# Patient Record
Sex: Female | Born: 1955 | Race: Black or African American | Hispanic: No | Marital: Married | Smoking: Never smoker
Health system: Southern US, Community
[De-identification: ages and names within clinical notes are randomized; demographics above are authoritative.]

## PROBLEM LIST (undated history)

## (undated) DIAGNOSIS — E785 Hyperlipidemia, unspecified: Secondary | ICD-10-CM

## (undated) DIAGNOSIS — R7302 Impaired glucose tolerance (oral): Secondary | ICD-10-CM

## (undated) HISTORY — PX: ABDOMINAL HYSTERECTOMY: SHX81

---

## 2004-05-20 ENCOUNTER — Ambulatory Visit: Payer: Self-pay | Admitting: Obstetrics and Gynecology

## 2005-07-19 ENCOUNTER — Ambulatory Visit: Payer: Self-pay | Admitting: Obstetrics and Gynecology

## 2006-07-25 ENCOUNTER — Ambulatory Visit: Payer: Self-pay | Admitting: Obstetrics and Gynecology

## 2007-09-20 ENCOUNTER — Ambulatory Visit: Payer: Self-pay | Admitting: Obstetrics and Gynecology

## 2008-09-22 ENCOUNTER — Ambulatory Visit: Payer: Self-pay | Admitting: Obstetrics and Gynecology

## 2009-09-30 ENCOUNTER — Ambulatory Visit: Payer: Self-pay | Admitting: Obstetrics and Gynecology

## 2010-10-28 ENCOUNTER — Ambulatory Visit: Payer: Self-pay | Admitting: Obstetrics and Gynecology

## 2012-05-15 ENCOUNTER — Ambulatory Visit: Payer: Self-pay | Admitting: Nurse Practitioner

## 2013-05-16 ENCOUNTER — Ambulatory Visit: Payer: Self-pay | Admitting: Nurse Practitioner

## 2014-07-30 ENCOUNTER — Ambulatory Visit
Admit: 2014-07-30 | Disposition: A | Discharge status: Home / Self Care | Payer: Self-pay | Attending: Nurse Practitioner | Admitting: Nurse Practitioner

## 2015-01-16 ENCOUNTER — Encounter: Payer: Self-pay | Admitting: *Deleted

## 2015-01-19 ENCOUNTER — Ambulatory Visit: Payer: BC Managed Care – PPO | Admitting: Anesthesiology

## 2015-01-19 ENCOUNTER — Encounter
Admission: RE | Disposition: A | Discharge status: Home / Self Care | Payer: Self-pay | Source: Ambulatory Visit | Attending: Gastroenterology

## 2015-01-19 ENCOUNTER — Ambulatory Visit
Admission: RE | Admit: 2015-01-19 | Discharge: 2015-01-19 | Disposition: A | Discharge status: Home / Self Care | Payer: BC Managed Care – PPO | Source: Ambulatory Visit | Attending: Gastroenterology | Admitting: Gastroenterology

## 2015-01-19 ENCOUNTER — Encounter: Payer: Self-pay | Admitting: *Deleted

## 2015-01-19 DIAGNOSIS — Z79899 Other long term (current) drug therapy: Secondary | ICD-10-CM | POA: Diagnosis not present

## 2015-01-19 DIAGNOSIS — R7302 Impaired glucose tolerance (oral): Secondary | ICD-10-CM | POA: Insufficient documentation

## 2015-01-19 DIAGNOSIS — K5730 Diverticulosis of large intestine without perforation or abscess without bleeding: Secondary | ICD-10-CM | POA: Diagnosis not present

## 2015-01-19 DIAGNOSIS — E785 Hyperlipidemia, unspecified: Secondary | ICD-10-CM | POA: Diagnosis not present

## 2015-01-19 DIAGNOSIS — Z7982 Long term (current) use of aspirin: Secondary | ICD-10-CM | POA: Diagnosis not present

## 2015-01-19 DIAGNOSIS — Z8371 Family history of colonic polyps: Secondary | ICD-10-CM | POA: Insufficient documentation

## 2015-01-19 HISTORY — DX: Hyperlipidemia, unspecified: E78.5

## 2015-01-19 HISTORY — PX: COLONOSCOPY WITH PROPOFOL: SHX5780

## 2015-01-19 HISTORY — DX: Impaired glucose tolerance (oral): R73.02

## 2015-01-19 SURGERY — COLONOSCOPY WITH PROPOFOL
Anesthesia: General

## 2015-01-19 MED ORDER — SODIUM CHLORIDE 0.9 % IV SOLN: INTRAVENOUS | Status: DC

## 2015-01-19 MED ORDER — FENTANYL CITRATE (PF) 100 MCG/2ML IJ SOLN
INTRAMUSCULAR | Status: DC | PRN
Start: 2015-01-19 — End: 2015-01-19
  Administered 2015-01-19: 1 ug via INTRAVENOUS

## 2015-01-19 MED ORDER — PROPOFOL 10 MG/ML IV BOLUS
INTRAVENOUS | Status: DC | PRN
  Administered 2015-01-19 (×2): 30 mg via INTRAVENOUS

## 2015-01-19 MED ORDER — MIDAZOLAM HCL 2 MG/2ML IJ SOLN
INTRAMUSCULAR | Status: DC | PRN
  Administered 2015-01-19: 1 mg via INTRAVENOUS

## 2015-01-19 MED ORDER — PROPOFOL INFUSION 10 MG/ML OPTIME
INTRAVENOUS | Status: DC | PRN
  Administered 2015-01-19: 120 ug/kg/min via INTRAVENOUS

## 2015-01-19 MED ORDER — SODIUM CHLORIDE 0.9 % IV SOLN
INTRAVENOUS | Status: DC
  Administered 2015-01-19: 09:00:00 via INTRAVENOUS

## 2015-01-19 NOTE — Anesthesia Postprocedure Evaluation (Signed)
  Anesthesia Post-op Note  Patient: Amy Schwartz  Procedure(s) Performed: Procedure(s): COLONOSCOPY WITH PROPOFOL (N/A)  Anesthesia type:General  Patient location: PACU  Post pain: Pain level controlled  Post assessment: Post-op Vital signs reviewed, Patient's Cardiovascular Status Stable, Respiratory Function Stable, Patent Airway and No signs of Nausea or vomiting  Post vital signs: Reviewed and stable  Last Vitals:  Filed Vitals:   01/19/15 0851  BP: 165/87  Pulse: 54  Temp: 35.7 C  Resp: 18    Level of consciousness: awake, alert  and patient cooperative  Complications: No apparent anesthesia complications

## 2015-01-19 NOTE — Transfer of Care (Signed)
Immediate Anesthesia Transfer of Care Note  Patient: Amy Schwartz  Procedure(s) Performed: Procedure(s): COLONOSCOPY WITH PROPOFOL (N/A)  Patient Location: PACU and Endoscopy Unit  Anesthesia Type:General  Level of Consciousness: sedated  Airway & Oxygen Therapy: Patient connected to nasal cannula oxygen  Post-op Assessment: Report given to RN  Post vital signs: Reviewed  Last Vitals:  Filed Vitals:   01/19/15 0851  BP: 165/87  Pulse: 54  Temp: 35.7 C  Resp: 18    Complications: No apparent anesthesia complications

## 2015-01-19 NOTE — H&P (Signed)
Outpatient short stay form Pre-procedure 01/19/2015 9:18 AM Christena Deem MD  Primary Physician: Bartolo Darter NP  Reason for visit:  Screening colonoscopy  History of present illness:  Patient is a 59 year old female presenting for screening colonoscopy. There is a family history of polyps in her mother but no family history of colon cancer to her knowledge. She tolerated her prep well. She takes no aspirin products at present or anticoagulation medications. He has had no aspirin for about a week.    Current facility-administered medications:  .  0.9 %  sodium chloride infusion, , Intravenous, Continuous, Christena Deem, MD, Last Rate: 20 mL/hr at 01/19/15 0909 .  0.9 %  sodium chloride infusion, , Intravenous, Continuous, Christena Deem, MD  Prescriptions prior to admission  Medication Sig Dispense Refill Last Dose  . aspirin EC 81 MG tablet Take 81 mg by mouth daily.   Past Week at Unknown time  . b complex vitamins tablet Take 1 tablet by mouth daily.     . calcium carbonate (OSCAL) 1500 (600 CA) MG TABS tablet Take by mouth 2 (two) times daily with a meal.     . Cinnamon Bark POWD by Does not apply route.     . diphenhydramine-acetaminophen (TYLENOL PM) 25-500 MG TABS Take 1 tablet by mouth at bedtime as needed.     . Investigational omega-3-fatty acid/placebo capsule [id number] Take 3 capsules by mouth 2 (two) times daily. Take with food.        No Known Allergies   Past Medical History  Diagnosis Date  . Hyperlipidemia   . Impaired glucose tolerance     Review of systems:      Physical Exam    Heart and lungs: Regular rate and rhythm without rub or gallop, lungs are bilaterally clear    HEENT: Normocephalic atraumatic eyes are anicteric    Other:     Pertinant exam for procedure: Soft nontender nondistended bowel sounds positive normoactive    Planned proceedures: Colonoscopy and indicated procedures I have discussed the risks benefits and complications of  procedures to include not limited to bleeding, infection, perforation and the risk of sedation and the patient wishes to proceed.    Christena Deem, MD Gastroenterology 01/19/2015  9:18 AM

## 2015-01-19 NOTE — Anesthesia Postprocedure Evaluation (Signed)
  Anesthesia Post-op Note  Patient: Amy Schwartz  Procedure(s) Performed: Procedure(s): COLONOSCOPY WITH PROPOFOL (N/A)  Anesthesia type:General  Patient location: PACU  Post pain: Pain level controlled  Post assessment: Post-op Vital signs reviewed, Patient's Cardiovascular Status Stable, Respiratory Function Stable, Patent Airway and No signs of Nausea or vomiting  Post vital signs: Reviewed and stable  Last Vitals:  Filed Vitals:   01/19/15 0851  BP: 165/87  Pulse: 54  Temp: 35.7 C  Resp: 18    Level of consciousness: awake, alert  and patient cooperative  Complications: No apparent anesthesia complications  

## 2015-01-19 NOTE — Anesthesia Preprocedure Evaluation (Signed)
Anesthesia Evaluation  Patient identified by MRN, date of birth, ID band Patient awake    Reviewed: Allergy & Precautions, NPO status , Patient's Chart, lab work & pertinent test results  History of Anesthesia Complications Negative for: history of anesthetic complications  Airway Mallampati: II       Dental no notable dental hx.    Pulmonary neg pulmonary ROS,    Pulmonary exam normal        Cardiovascular negative cardio ROS Normal cardiovascular exam     Neuro/Psych    GI/Hepatic negative GI ROS, Neg liver ROS,   Endo/Other  negative endocrine ROS  Renal/GU negative Renal ROS     Musculoskeletal negative musculoskeletal ROS (+)   Abdominal Normal abdominal exam  (+)   Peds negative pediatric ROS (+)  Hematology negative hematology ROS (+)   Anesthesia Other Findings   Reproductive/Obstetrics negative OB ROS                             Anesthesia Physical Anesthesia Plan  ASA: II  Anesthesia Plan: General   Post-op Pain Management:    Induction: Intravenous  Airway Management Planned: Nasal Cannula  Additional Equipment:   Intra-op Plan:   Post-operative Plan:   Informed Consent: I have reviewed the patients History and Physical, chart, labs and discussed the procedure including the risks, benefits and alternatives for the proposed anesthesia with the patient or authorized representative who has indicated his/her understanding and acceptance.     Plan Discussed with: Surgeon  Anesthesia Plan Comments:         Anesthesia Quick Evaluation

## 2015-01-19 NOTE — Op Note (Signed)
[facility] Gastroenterology Patient Name: Amy Schwartz Procedure Date: 01/19/2015 9:19 AM MRN: [id number] Account #: [id number] Date of Birth: 02/03/1956 Admit Type: Outpatient Age: 59 Room: [facility] ENDO ROOM 2 Gender: Female Note Status: Finalized Procedure:         Colonoscopy Indications:       Family history of colonic polyps in a first-degree relative Providers:         Christena Deem, MD Referring MD:      Caryl Asp (Referring MD) Medicines:         Monitored Anesthesia Care Complications:     No immediate complications. Procedure:         Pre-Anesthesia Assessment:                    - ASA Grade Assessment: II - A patient with mild systemic                     disease.                    After obtaining informed consent, the colonoscope was                     passed under direct vision. Throughout the procedure, the                     patient's blood pressure, pulse, and oxygen saturations                     were monitored continuously. The Colonoscope was                     introduced through the anus and advanced to the the cecum,                     identified by appendiceal orifice and ileocecal valve. The                     colonoscopy was performed without difficulty. The patient                     tolerated the procedure well. The quality of the bowel                     preparation was good. Findings:      Many small-mouthed diverticula were found in the sigmoid colon, in the       distal descending colon and in the ascending colon.      An area of mildly erythematous mucosa was found in the distal rectum.       Biopsies were taken with a cold forceps for histology, biopsy also of       normal tissue in the distal sigmoid for comparison.      The digital rectal exam was normal.      The retroflexed view of the distal rectum and anal verge was normal and       showed no anal or rectal abnormalities. Impression:        -  Diverticulosis in the sigmoid colon, in the distal                     descending colon and in the ascending colon.                    -  Erythematous mucosa in the distal rectum. Biopsied.                    - The distal rectum and anal verge are normal on                     retroflexion view. Recommendation:    - Await pathology results.                    - Telephone GI clinic for pathology results in 1 week. Procedure Code(s): --- Professional ---                    [phone], Colonoscopy, flexible; with biopsy, single or                     multiple Diagnosis Code(s): --- Professional ---                    [phone], Other specified disorders of rectum and anus                    V18.51, Family history of colonic polyps                    562.10, Diverticulosis of colon (without mention of                     hemorrhage) CPT copyright 2014 [employer]. All rights reserved. The codes documented in this report are preliminary and upon coder review may  be revised to meet current compliance requirements. Christena Deem, MD 01/19/2015 9:44:54 AM This report has been signed electronically. Number of Addenda: 0 Note Initiated On: 01/19/2015 9:19 AM Scope Withdrawal Time: 0 hours 10 minutes 3 seconds  Total Procedure Duration: 0 hours 15 minutes 10 seconds       [facility]

## 2015-01-20 LAB — SURGICAL PATHOLOGY

## 2015-01-21 ENCOUNTER — Encounter: Payer: Self-pay | Admitting: Gastroenterology

## 2016-10-19 ENCOUNTER — Other Ambulatory Visit: Payer: Self-pay | Admitting: Nurse Practitioner

## 2016-10-19 DIAGNOSIS — Z1231 Encounter for screening mammogram for malignant neoplasm of breast: Secondary | ICD-10-CM

## 2017-02-21 ENCOUNTER — Ambulatory Visit
Admission: RE | Admit: 2017-02-21 | Discharge: 2017-02-21 | Disposition: A | Discharge status: Home / Self Care | Payer: BC Managed Care – PPO | Source: Ambulatory Visit | Attending: Nurse Practitioner | Admitting: Nurse Practitioner

## 2017-02-21 DIAGNOSIS — Z1231 Encounter for screening mammogram for malignant neoplasm of breast: Secondary | ICD-10-CM | POA: Diagnosis present

## 2017-10-24 ENCOUNTER — Other Ambulatory Visit: Payer: Self-pay | Admitting: Nurse Practitioner

## 2017-10-24 DIAGNOSIS — Z1231 Encounter for screening mammogram for malignant neoplasm of breast: Secondary | ICD-10-CM

## 2018-03-08 ENCOUNTER — Ambulatory Visit
Admission: RE | Admit: 2018-03-08 | Discharge: 2018-03-08 | Disposition: A | Discharge status: Home / Self Care | Payer: BC Managed Care – PPO | Source: Ambulatory Visit | Attending: Nurse Practitioner | Admitting: Nurse Practitioner

## 2018-03-08 DIAGNOSIS — Z1231 Encounter for screening mammogram for malignant neoplasm of breast: Secondary | ICD-10-CM | POA: Insufficient documentation

## 2018-12-13 ENCOUNTER — Other Ambulatory Visit: Payer: Self-pay | Admitting: Nurse Practitioner

## 2018-12-13 DIAGNOSIS — Z1231 Encounter for screening mammogram for malignant neoplasm of breast: Secondary | ICD-10-CM

## 2019-07-05 ENCOUNTER — Other Ambulatory Visit: Payer: Self-pay

## 2019-07-05 ENCOUNTER — Ambulatory Visit
Admission: RE | Admit: 2019-07-05 | Discharge: 2019-07-05 | Disposition: A | Discharge status: Home / Self Care | Payer: BC Managed Care – PPO | Source: Ambulatory Visit | Attending: Nurse Practitioner | Admitting: Nurse Practitioner

## 2019-07-05 DIAGNOSIS — Z1231 Encounter for screening mammogram for malignant neoplasm of breast: Secondary | ICD-10-CM | POA: Insufficient documentation

## 2020-06-02 ENCOUNTER — Other Ambulatory Visit: Payer: Self-pay | Admitting: Nurse Practitioner

## 2020-06-18 ENCOUNTER — Other Ambulatory Visit: Payer: Self-pay | Admitting: Nurse Practitioner

## 2020-06-18 DIAGNOSIS — Z1231 Encounter for screening mammogram for malignant neoplasm of breast: Secondary | ICD-10-CM

## 2020-07-07 ENCOUNTER — Ambulatory Visit
Admission: RE | Admit: 2020-07-07 | Discharge: 2020-07-07 | Disposition: A | Discharge status: Home / Self Care | Payer: BC Managed Care – PPO | Source: Ambulatory Visit | Attending: Nurse Practitioner | Admitting: Nurse Practitioner

## 2020-07-07 ENCOUNTER — Other Ambulatory Visit: Payer: Self-pay

## 2020-07-07 DIAGNOSIS — Z1231 Encounter for screening mammogram for malignant neoplasm of breast: Secondary | ICD-10-CM | POA: Insufficient documentation

## 2021-06-22 ENCOUNTER — Other Ambulatory Visit: Payer: Self-pay | Admitting: Nurse Practitioner

## 2021-06-22 DIAGNOSIS — Z1231 Encounter for screening mammogram for malignant neoplasm of breast: Secondary | ICD-10-CM

## 2021-07-27 ENCOUNTER — Ambulatory Visit
Admission: RE | Admit: 2021-07-27 | Discharge: 2021-07-27 | Disposition: A | Discharge status: Home / Self Care | Payer: Medicare PPO | Source: Ambulatory Visit | Attending: Nurse Practitioner | Admitting: Nurse Practitioner

## 2021-07-27 ENCOUNTER — Other Ambulatory Visit: Payer: Self-pay

## 2021-07-27 DIAGNOSIS — Z1231 Encounter for screening mammogram for malignant neoplasm of breast: Secondary | ICD-10-CM | POA: Insufficient documentation

## 2022-06-03 DIAGNOSIS — H40003 Preglaucoma, unspecified, bilateral: Secondary | ICD-10-CM | POA: Diagnosis not present

## 2022-06-09 ENCOUNTER — Other Ambulatory Visit: Payer: Self-pay | Admitting: Nurse Practitioner

## 2022-06-09 DIAGNOSIS — I10 Essential (primary) hypertension: Secondary | ICD-10-CM | POA: Diagnosis not present

## 2022-06-09 DIAGNOSIS — R002 Palpitations: Secondary | ICD-10-CM | POA: Diagnosis not present

## 2022-06-09 DIAGNOSIS — Z1231 Encounter for screening mammogram for malignant neoplasm of breast: Secondary | ICD-10-CM

## 2022-06-09 DIAGNOSIS — E7800 Pure hypercholesterolemia, unspecified: Secondary | ICD-10-CM | POA: Diagnosis not present

## 2022-06-09 DIAGNOSIS — Z79899 Other long term (current) drug therapy: Secondary | ICD-10-CM | POA: Diagnosis not present

## 2022-06-09 DIAGNOSIS — R7302 Impaired glucose tolerance (oral): Secondary | ICD-10-CM | POA: Diagnosis not present

## 2022-08-10 ENCOUNTER — Ambulatory Visit
Admission: RE | Admit: 2022-08-10 | Discharge: 2022-08-10 | Disposition: A | Discharge status: Home / Self Care | Payer: Medicare PPO | Source: Ambulatory Visit | Attending: Nurse Practitioner | Admitting: Nurse Practitioner

## 2022-08-10 DIAGNOSIS — Z1231 Encounter for screening mammogram for malignant neoplasm of breast: Secondary | ICD-10-CM | POA: Insufficient documentation

## 2022-12-15 DIAGNOSIS — E7800 Pure hypercholesterolemia, unspecified: Secondary | ICD-10-CM | POA: Diagnosis not present

## 2022-12-15 DIAGNOSIS — I10 Essential (primary) hypertension: Secondary | ICD-10-CM | POA: Diagnosis not present

## 2022-12-15 DIAGNOSIS — R002 Palpitations: Secondary | ICD-10-CM | POA: Diagnosis not present

## 2022-12-15 DIAGNOSIS — Z79899 Other long term (current) drug therapy: Secondary | ICD-10-CM | POA: Diagnosis not present

## 2022-12-15 DIAGNOSIS — R7302 Impaired glucose tolerance (oral): Secondary | ICD-10-CM | POA: Diagnosis not present

## 2022-12-15 DIAGNOSIS — Z0000 Encounter for general adult medical examination without abnormal findings: Secondary | ICD-10-CM | POA: Diagnosis not present

## 2022-12-15 DIAGNOSIS — Z1331 Encounter for screening for depression: Secondary | ICD-10-CM | POA: Diagnosis not present

## 2022-12-27 DIAGNOSIS — R002 Palpitations: Secondary | ICD-10-CM | POA: Diagnosis not present

## 2022-12-27 DIAGNOSIS — I10 Essential (primary) hypertension: Secondary | ICD-10-CM | POA: Diagnosis not present

## 2022-12-27 DIAGNOSIS — R011 Cardiac murmur, unspecified: Secondary | ICD-10-CM | POA: Diagnosis not present

## 2022-12-27 DIAGNOSIS — R001 Bradycardia, unspecified: Secondary | ICD-10-CM | POA: Diagnosis not present

## 2022-12-27 DIAGNOSIS — E7800 Pure hypercholesterolemia, unspecified: Secondary | ICD-10-CM | POA: Diagnosis not present

## 2023-01-12 DIAGNOSIS — R42 Dizziness and giddiness: Secondary | ICD-10-CM | POA: Diagnosis not present

## 2023-01-12 DIAGNOSIS — H9011 Conductive hearing loss, unilateral, right ear, with unrestricted hearing on the contralateral side: Secondary | ICD-10-CM | POA: Diagnosis not present

## 2023-01-12 DIAGNOSIS — H6123 Impacted cerumen, bilateral: Secondary | ICD-10-CM | POA: Diagnosis not present

## 2023-01-12 DIAGNOSIS — H903 Sensorineural hearing loss, bilateral: Secondary | ICD-10-CM | POA: Diagnosis not present

## 2023-01-12 DIAGNOSIS — H6982 Other specified disorders of Eustachian tube, left ear: Secondary | ICD-10-CM | POA: Diagnosis not present

## 2023-01-12 DIAGNOSIS — J301 Allergic rhinitis due to pollen: Secondary | ICD-10-CM | POA: Diagnosis not present

## 2023-01-16 DIAGNOSIS — R002 Palpitations: Secondary | ICD-10-CM | POA: Diagnosis not present

## 2023-01-16 DIAGNOSIS — R011 Cardiac murmur, unspecified: Secondary | ICD-10-CM | POA: Diagnosis not present

## 2023-06-06 DIAGNOSIS — H40003 Preglaucoma, unspecified, bilateral: Secondary | ICD-10-CM | POA: Diagnosis not present

## 2023-06-06 DIAGNOSIS — H2513 Age-related nuclear cataract, bilateral: Secondary | ICD-10-CM | POA: Diagnosis not present

## 2023-06-26 ENCOUNTER — Other Ambulatory Visit: Payer: Self-pay | Admitting: Nurse Practitioner

## 2023-06-26 DIAGNOSIS — R7302 Impaired glucose tolerance (oral): Secondary | ICD-10-CM | POA: Diagnosis not present

## 2023-06-26 DIAGNOSIS — Z1231 Encounter for screening mammogram for malignant neoplasm of breast: Secondary | ICD-10-CM

## 2023-06-26 DIAGNOSIS — E7800 Pure hypercholesterolemia, unspecified: Secondary | ICD-10-CM | POA: Diagnosis not present

## 2023-06-26 DIAGNOSIS — Z79899 Other long term (current) drug therapy: Secondary | ICD-10-CM | POA: Diagnosis not present

## 2023-06-26 DIAGNOSIS — R002 Palpitations: Secondary | ICD-10-CM | POA: Diagnosis not present

## 2023-06-26 DIAGNOSIS — I10 Essential (primary) hypertension: Secondary | ICD-10-CM | POA: Diagnosis not present

## 2023-06-27 DIAGNOSIS — R001 Bradycardia, unspecified: Secondary | ICD-10-CM | POA: Diagnosis not present

## 2023-06-27 DIAGNOSIS — I10 Essential (primary) hypertension: Secondary | ICD-10-CM | POA: Diagnosis not present

## 2023-06-27 DIAGNOSIS — E7800 Pure hypercholesterolemia, unspecified: Secondary | ICD-10-CM | POA: Diagnosis not present

## 2023-06-27 DIAGNOSIS — R002 Palpitations: Secondary | ICD-10-CM | POA: Diagnosis not present

## 2023-07-09 IMAGING — MG MM DIGITAL SCREENING BILAT W/ TOMO AND CAD
8 series · 8 of 24 positions shown · non-contrast
Comparison: Previous exam(s).

CLINICAL DATA: Screening.

EXAM:
DIGITAL SCREENING BILATERAL MAMMOGRAM WITH TOMOSYNTHESIS AND CAD
TECHNIQUE: Bilateral screening digital craniocaudal and mediolateral oblique
mammograms were obtained. Bilateral screening digital breast
tomosynthesis was performed. The images were evaluated with
computer-aided detection.

[R MLO synth-2D]
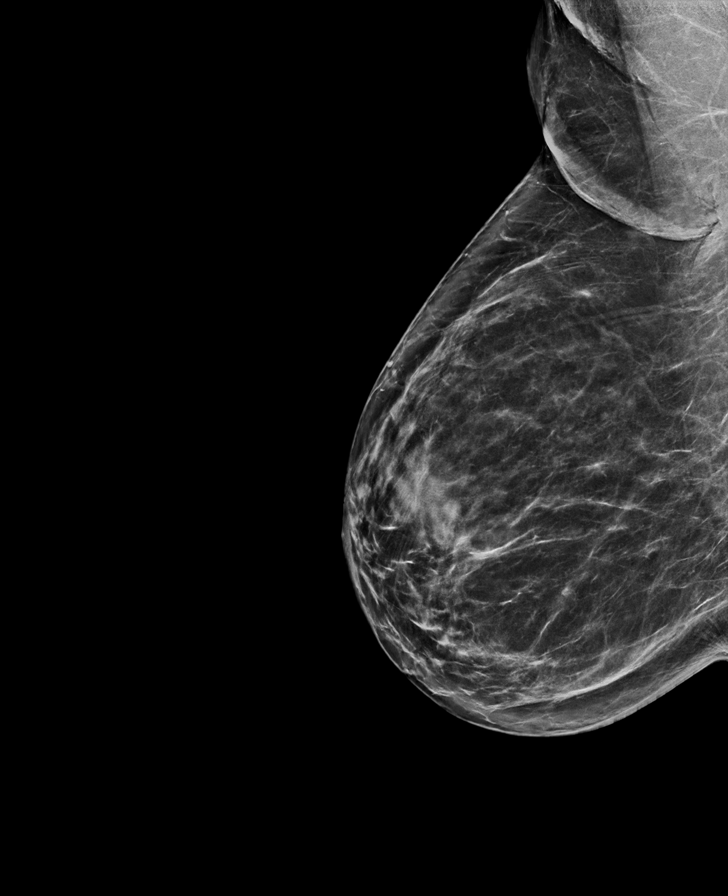

[R CC synth-2D]
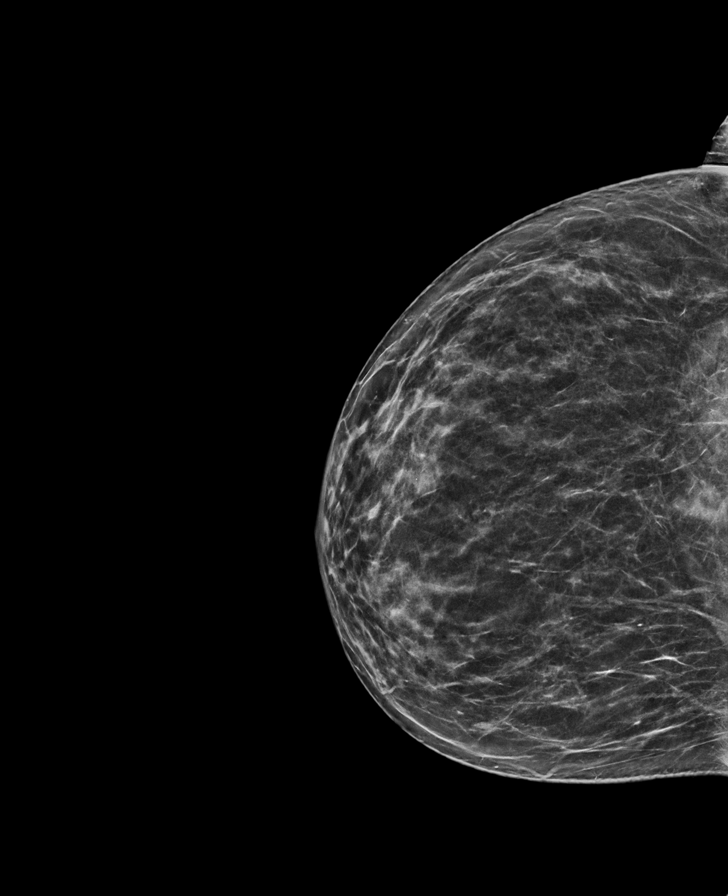

[L CC synth-2D]
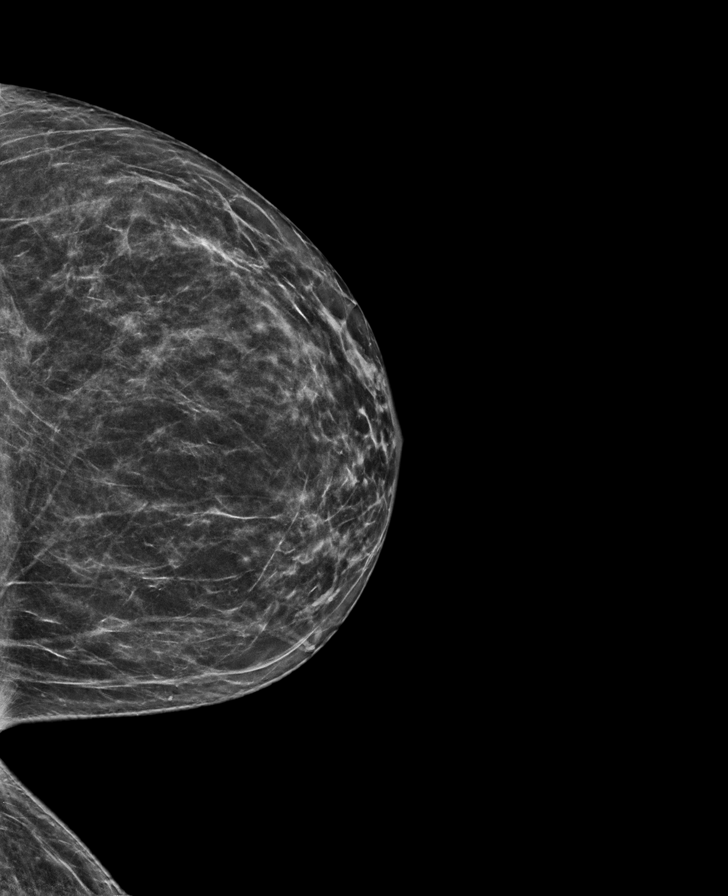

[L MLO synth-2D]
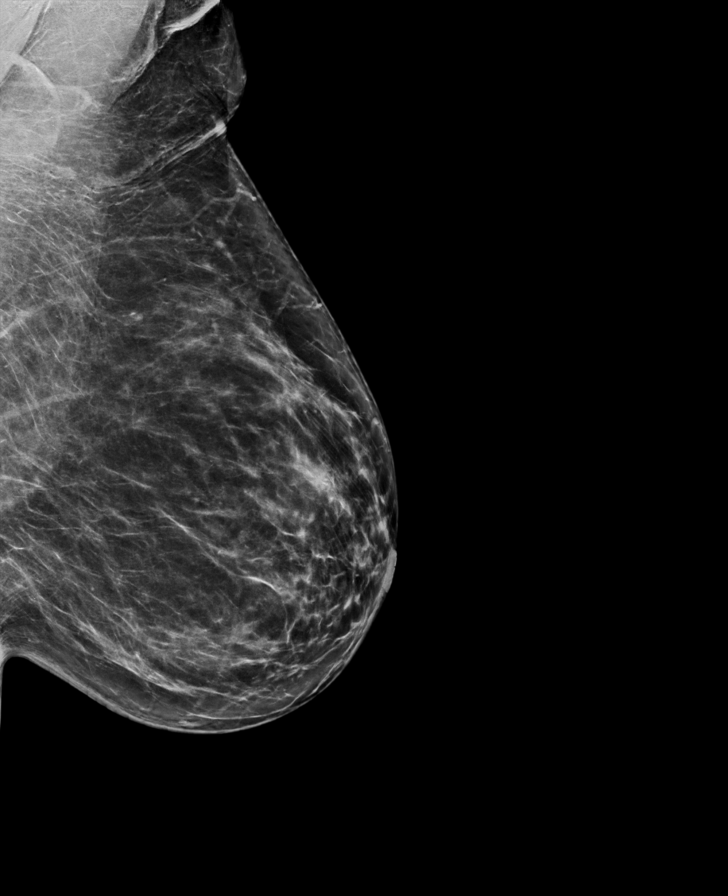

[R MLO tomo · tomo slice 37/74.0]
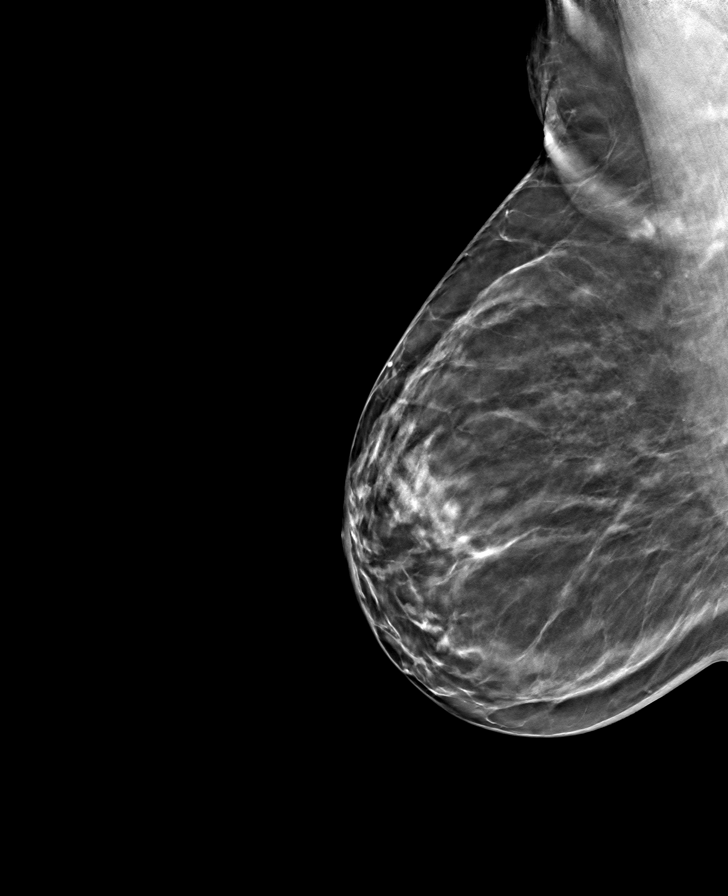

[L MLO tomo · tomo slice 41/82.0]
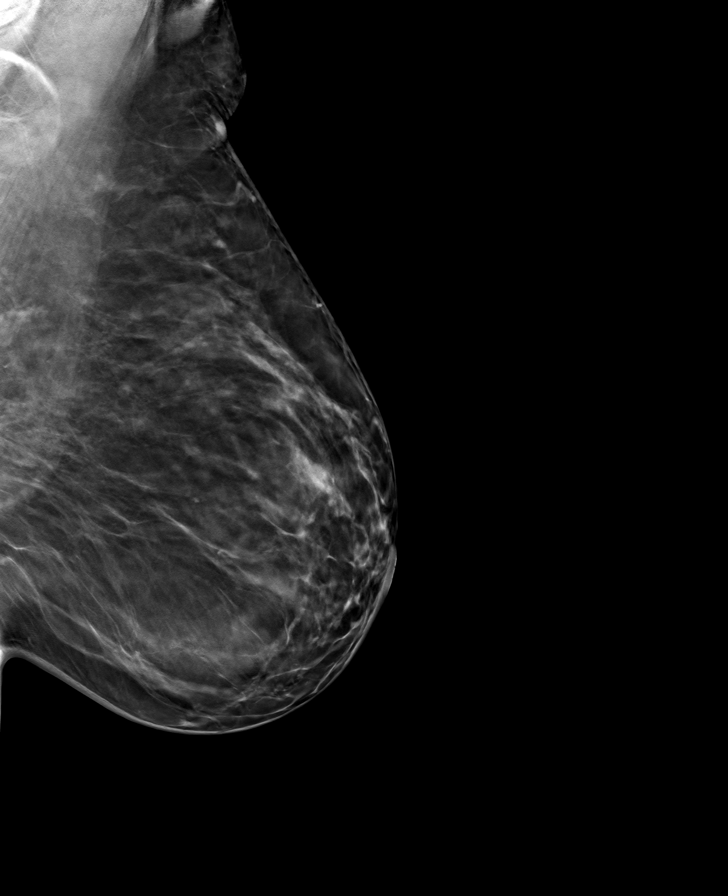

[R CC tomo · tomo slice 33/64.0]
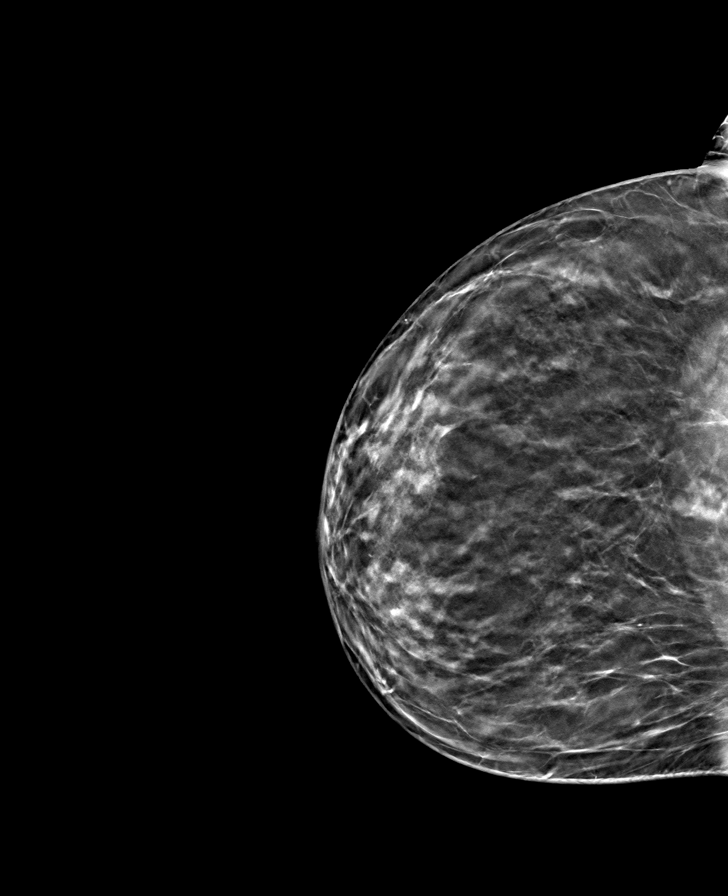

[L CC tomo · tomo slice 32/63.0]
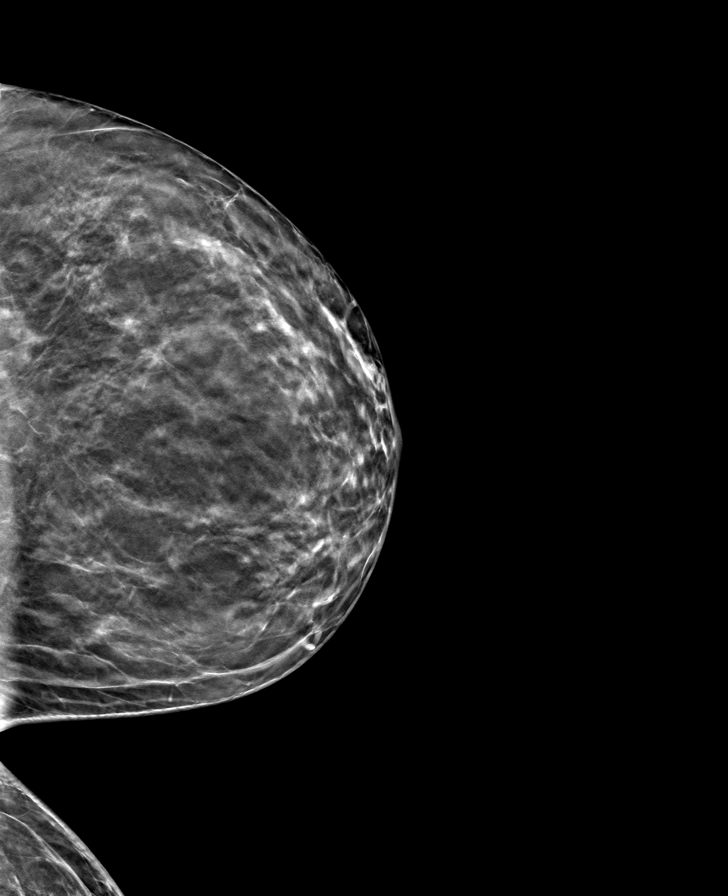

[8 of 24 positions shown; findings below may reference images not displayed]

ACR Breast Density Category b: There are scattered areas of
fibroglandular density.
FINDINGS: There are no findings suspicious for malignancy.
IMPRESSION: No mammographic evidence of malignancy. A result letter of this
screening mammogram will be mailed directly to the patient.

RECOMMENDATION:
Screening mammogram in one year. (Code:51-O-LD2)

BI-RADS CATEGORY  1: Negative.

## 2023-08-22 ENCOUNTER — Ambulatory Visit
Admission: RE | Admit: 2023-08-22 | Discharge: 2023-08-22 | Disposition: A | Discharge status: Home / Self Care | Source: Ambulatory Visit | Attending: Nurse Practitioner | Admitting: Nurse Practitioner

## 2023-08-22 DIAGNOSIS — Z1231 Encounter for screening mammogram for malignant neoplasm of breast: Secondary | ICD-10-CM | POA: Insufficient documentation

## 2023-12-27 DIAGNOSIS — R7302 Impaired glucose tolerance (oral): Secondary | ICD-10-CM | POA: Diagnosis not present

## 2023-12-27 DIAGNOSIS — Z79899 Other long term (current) drug therapy: Secondary | ICD-10-CM | POA: Diagnosis not present

## 2023-12-27 DIAGNOSIS — Z1331 Encounter for screening for depression: Secondary | ICD-10-CM | POA: Diagnosis not present

## 2023-12-27 DIAGNOSIS — Z0000 Encounter for general adult medical examination without abnormal findings: Secondary | ICD-10-CM | POA: Diagnosis not present

## 2023-12-27 DIAGNOSIS — I10 Essential (primary) hypertension: Secondary | ICD-10-CM | POA: Diagnosis not present

## 2023-12-27 DIAGNOSIS — R002 Palpitations: Secondary | ICD-10-CM | POA: Diagnosis not present

## 2023-12-27 DIAGNOSIS — E7800 Pure hypercholesterolemia, unspecified: Secondary | ICD-10-CM | POA: Diagnosis not present

## 2024-01-12 DIAGNOSIS — H903 Sensorineural hearing loss, bilateral: Secondary | ICD-10-CM | POA: Diagnosis not present

## 2024-01-12 DIAGNOSIS — H9311 Tinnitus, right ear: Secondary | ICD-10-CM | POA: Diagnosis not present

## 2024-01-12 DIAGNOSIS — H6123 Impacted cerumen, bilateral: Secondary | ICD-10-CM | POA: Diagnosis not present

## 2024-03-20 DIAGNOSIS — R002 Palpitations: Secondary | ICD-10-CM | POA: Diagnosis not present

## 2024-03-20 DIAGNOSIS — R001 Bradycardia, unspecified: Secondary | ICD-10-CM | POA: Diagnosis not present

## 2024-03-20 DIAGNOSIS — I10 Essential (primary) hypertension: Secondary | ICD-10-CM | POA: Diagnosis not present

## 2024-03-20 DIAGNOSIS — E7800 Pure hypercholesterolemia, unspecified: Secondary | ICD-10-CM | POA: Diagnosis not present
